# Patient Record
Sex: Male | Born: 1950 | Race: White | Hispanic: No | Marital: Married
Health system: Southern US, Community
[De-identification: ages and names within clinical notes are randomized; demographics above are authoritative.]

---

## 2006-01-14 ENCOUNTER — Observation Stay (HOSPITAL_COMMUNITY): Admission: EM | Admit: 2006-01-14 | Discharge: 2006-01-15 | Payer: Self-pay | Admitting: Emergency Medicine

## 2006-01-16 ENCOUNTER — Emergency Department (HOSPITAL_COMMUNITY): Admission: EM | Admit: 2006-01-16 | Discharge: 2006-01-16 | Payer: Self-pay | Admitting: Emergency Medicine

## 2006-07-23 ENCOUNTER — Ambulatory Visit: Payer: Self-pay | Admitting: Gastroenterology

## 2006-07-24 ENCOUNTER — Ambulatory Visit: Payer: Self-pay | Admitting: Gastroenterology

## 2006-07-24 ENCOUNTER — Encounter (INDEPENDENT_AMBULATORY_CARE_PROVIDER_SITE_OTHER): Payer: Self-pay | Admitting: Specialist

## 2006-07-24 DIAGNOSIS — K449 Diaphragmatic hernia without obstruction or gangrene: Secondary | ICD-10-CM | POA: Insufficient documentation

## 2006-07-24 DIAGNOSIS — K2970 Gastritis, unspecified, without bleeding: Secondary | ICD-10-CM | POA: Insufficient documentation

## 2006-07-24 DIAGNOSIS — K2990 Gastroduodenitis, unspecified, without bleeding: Secondary | ICD-10-CM

## 2006-08-11 ENCOUNTER — Ambulatory Visit: Payer: Self-pay | Admitting: Gastroenterology

## 2006-09-15 ENCOUNTER — Ambulatory Visit: Payer: Self-pay | Admitting: Gastroenterology

## 2007-07-15 DIAGNOSIS — M129 Arthropathy, unspecified: Secondary | ICD-10-CM | POA: Insufficient documentation

## 2008-03-17 IMAGING — CR DG CHEST 1V PORT
1 series · 1 of 1 positions shown · non-contrast
Comparison: None.

CLINICAL DATA: Rapid atrial fibrillation. 
 PORTABLE CHEST ? 1 VIEW:

[view not recorded]
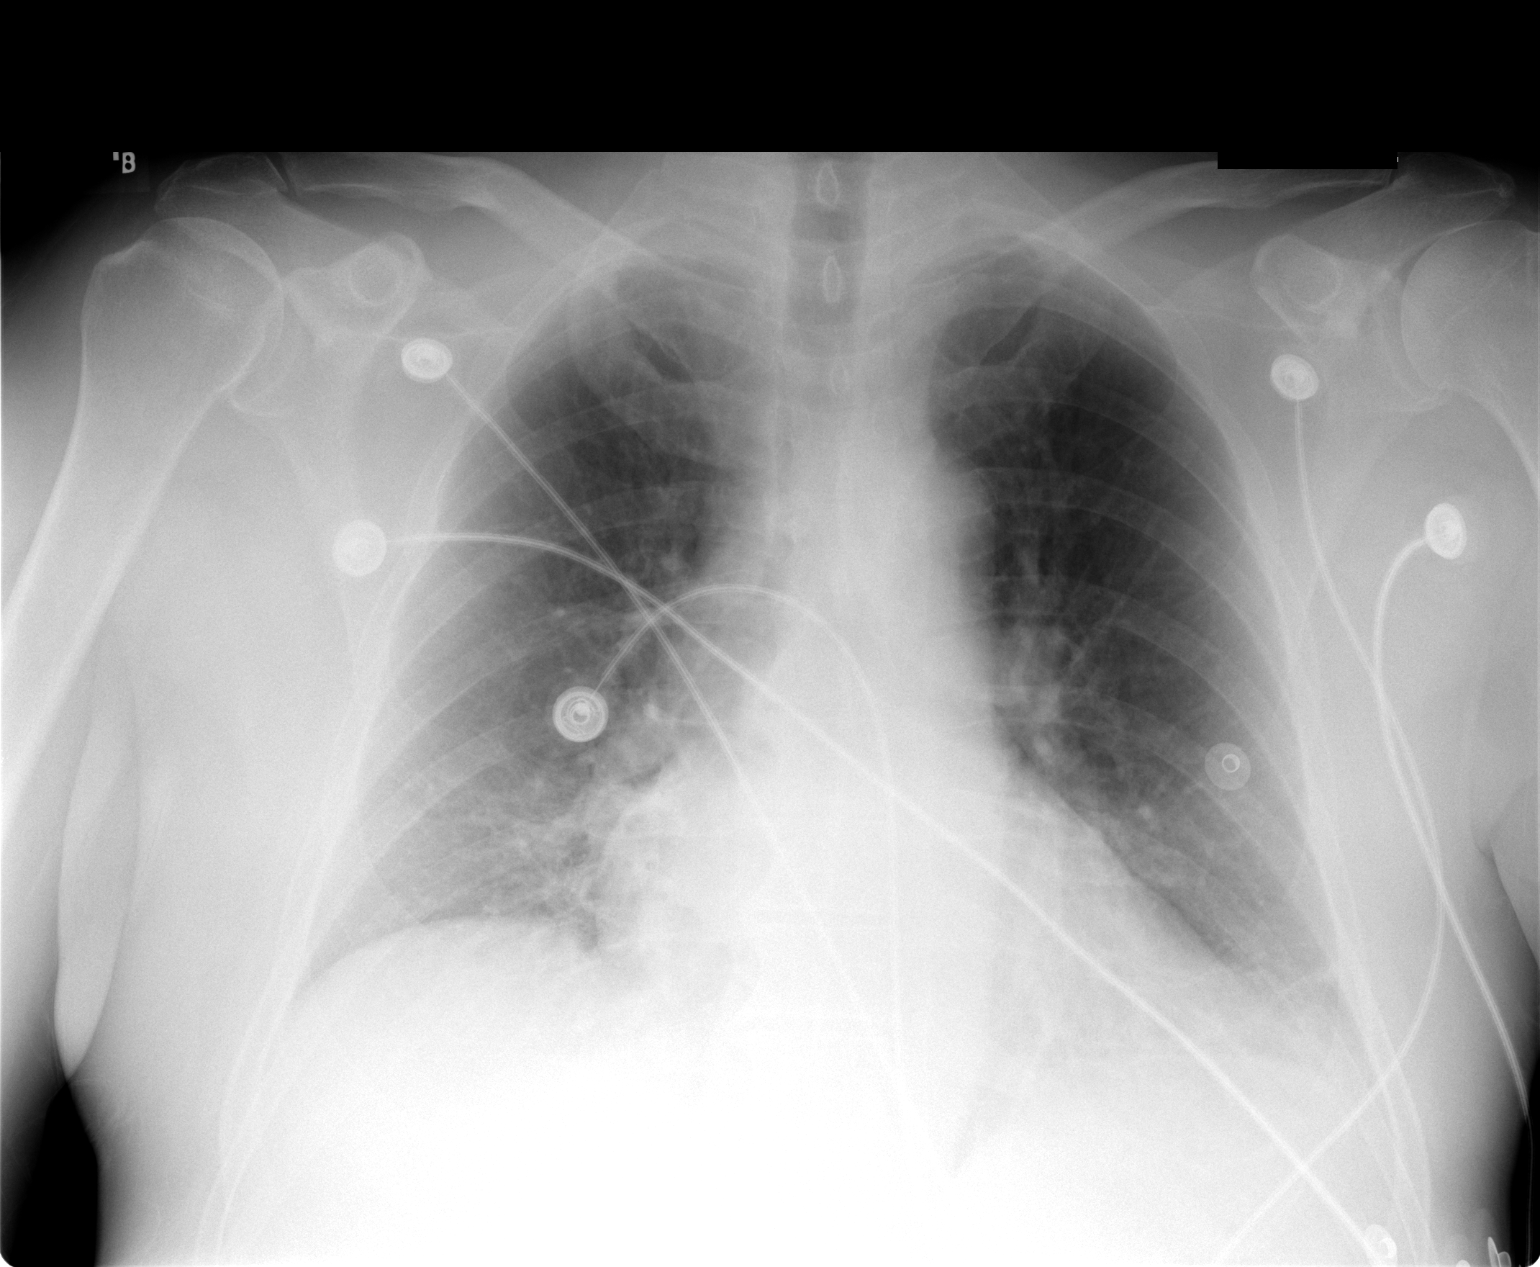

[1 of 1 positions shown; findings below may reference images not displayed]

FINDINGS: The heart size is normal.  There are no effusions or edema.  There is bibasilar atelectasis.
IMPRESSION: 1.  No evidence for heart failure. 
 2.  Bibasilar atelectasis.

## 2010-04-14 ENCOUNTER — Encounter: Payer: Self-pay | Admitting: Otolaryngology

## 2010-08-06 NOTE — Assessment & Plan Note (Signed)
[facility]                         GASTROENTEROLOGY OFFICE NOTE   NAME:[name], Thomas                         MRN:          [id number]  DATE:08/11/2006                            DOB:          [birth year]    [name] underwent endoscopy with empiric dilation on Jul 24, 2006. He  had been on Prevacid for the last week and is markedly improved in his  symptomatology. Is having no further dysphagia. Because of his  symptomatology, which is mostly in the evening, I have switched him to  Prevacid 30 mg before supper in the evening with twice a day therapy if  needed. He continues to have a low-grade fever and is on prednisone 10  mg a day for an unidentified rheumatic problem. I will see him back in  six weeks time or sooner if needed. He is to continue his other  medications as per his other physicians.     Vania Rea. Jarold Motto, MD, Caleen Essex, FAGA  Electronically Signed    DRP/MedQ  DD: 08/11/2006  DT: 08/11/2006  Job #: [id number]   cc:   Tammy R. Collins Scotland, M.D.  Jefry H. Pollyann Kennedy, MD  Aundra Dubin, M.D.

## 2010-08-06 NOTE — Assessment & Plan Note (Signed)
HEALTHCARE                         GASTROENTEROLOGY OFFICE NOTE   NAME:Wee, Darryel                         MRN:          [id number]  DATE:09/15/2006                            DOB:          1950/09/14    PROBLEM:  Gastritis.   HISTORY:  Mr. Critzer is a very nice 60 year old white male who has been  undergoing evaluation with Dr. Jarold Motto for complaints of intermittent  chest pain, dysphagia, and reflux.  He had been given a course of  Prevacid 30 mg b.i.d. after undergoing upper endoscopy on Jul 24, 2006  which did show hiatal hernia.  He did not have evidence of stricture,  but was Elease Hashimoto dilated to 56 due to his symptoms and had antral  gastritis.  He says that he is doing well from a GI standpoint and  really has not been having any difficulty with chest pain, gas,  heartburn, indigestion, etc.  He however has developed a rash over the  past 2-1/2 weeks which he describes as pruritic and somewhat  uncomfortable with a burning quality.  He says that he does feel that it  is progressing.  It is primarily located on his chest and upper back.  He does have a few scattered lesions on his upper extremities as well.  He has seen Dr, Dewain Penning his primary care physician yesterday and  she stopped several of his medications including the Prevacid and he is  scheduled to see a dermatologist on Thursday for a possible biopsy.  It  has been mentioned to him that this may be a vasculitic-type rash,  possibly associated with Lupus.  He has been on a trial of prednisone  per Dr. Kellie Simmering who has been working him up recently as well for other  systemic symptoms with joint pain and swelling, low grade fever, etc.  He is down to 7.5 mg of prednisone currently.   CURRENT MEDICATIONS:  1. Calcium supplement.  2. Prednisone 7.5 mg daily.  3. Iron supplement twice daily.   EXAMINATION:  Well-developed white male in no acute distress.  Weight is  157, blood  pressure 96/72, pulse is 84.  HEENT:  He does have some mild erythema of the face.  He has a fairly  bright, red, rather diffuse rash on the upper neck and chest which does  blanch.  It is fairly diffuse on the upper chest with more scattered  lesions on the lower chest and then on his back.  ABDOMEN:  Soft and benign.  Bowel sounds are active.   IMPRESSION:  32. A 59 year old white male with recent gastritis, resolved.  2. Non-specific upper gastrointestinal symptoms, probably related to      reflux, improved.  3. Status post empiric esophageal dilation with improvement in      symptoms.  He may have underlying esophageal involvement with an      autoimmune disorder as yet unspecified.  4. 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   PLAN:  He was advised to continue off of the the Prevacid as he is  improved, we did ask him to try some over the counter Zantac on a p.r.n.  basis.  He may benefit from the histamine blocker effect of this as  well.  Will follow up on a p.r.n. basis.      Mike Gip, PA-C  Electronically Signed      Vania Rea. Jarold Motto, MD, Caleen Essex, FAGA  Electronically Signed   AE/MedQ  DD: 09/15/2006  DT: 09/16/2006  Job #: [id number]   cc:   Aundra Dubin, M.D.  Tammy R. Collins Scotland, M.D.

## 2010-08-09 NOTE — Assessment & Plan Note (Signed)
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 Collins Scotland for evaluation of dysphagia.   HISTORY OF PRESENT ILLNESS:  Mr. Falotico has had one month of intermittent  solid food dysphagia in his retropharyngeal area.  He has had a negative  ENT exam by Dr. Pollyann Kennedy.  He is scheduled for a barium swallow tomorrow  apparently.  The patient denies reflux symptoms whatsoever but has had  some mild nausea.  All of his problems began, in his mind, after he had  an ibuprofen tablet stuck in his throat.  He has mild chronic functional  constipation.  He uses daily Metamucil, but denies rectal bleeding.  He  had a flexible sigmoidoscopy 5 years ago in [place].  He,  otherwise, denies gastrointestinal problems or any history of  pancreatitis or hepatitis.   The patient had new onset atrial fibrillation in October and was seen by  Dr. Elease Hashimoto and apparently underwent cardioversion.  He was placed on  Crestor, which in his mind caused a rheumatic problem with arthralgias  and myalgias, and he recently discontinued Crestor.  He saw Dr. Aundra Dubin last month and apparently he allegedly said that Dr. Kellie Simmering  told him that he had viral rheumatoid arthritis and placed him on  prednisone 10 mg a day.  He previously had been taking some NSAIDs  without improvement.  Additionally, he is on aspirin 81 mg a day, Osteo  Bi-Flex daily, calcium and vitamin D twice a day and multivitamins.   Since October the patient has had a 12 pound weight loss and he  complains of fever up to 101 to 102 degrees with rather marked night  sweats.  He has had no significant skin rashes.  His only previous  surgery is a tonsillectomy and  what sounds like a hernia repair as a  child.   ALLERGIES:  The patient is allergic to penicillin, latex and statins.   FAMILY HISTORY:  Remarkable for ovarian cancer in his sister and  atherogenesis in both parents.  There are no known gastrointestinal  problems.   SOCIAL HISTORY:  He is married and lives with his wife and son.  He has  a associates degree.  He does not smoke or use ethanol.   REVIEW OF SYSTEMS:  Remarkable for many complaints, including swollen  legs and joints, fever, night sweats, chronic fatigue since January,  persistent sore throat, shortness of breath to exertion, arthralgias  and myalgias.  He denies any other specific cardiopulmonary complaints.  Review of systems otherwise noncontributory.   EXAMINATION:  GENERAL:  Shows him to be a healthy-appearing white male  in no distress, appearing his stated age.  VITAL SIGNS:  He is 5 feet 9 inches and weighs 166 pounds.  Blood  pressure is 114/78, pulse was 80 and regular.  HEENT:  I could not appreciate thyromegaly or lymphadenopathy in  examination.  The oropharynx was unremarkable.  CHEST:  Clear to percussion and auscultation, and there was no murmurs,  gallops or rubs.  He appeared to be in a regular rhythm at this time.  ABDOMEN:  There was no hepatosplenomegaly, abdominal masses or  tenderness.  Bowel sounds were normal.  EXTREMITIES:  I could not appreciate any swollen joints, tender muscles,  edema, etc.  NEUROLOGIC:  His mental status was clear.  RECTAL:  Deferred.   ASSESSMENT:  1. 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  As per above, he denies any      reflux symptoms whatsoever.  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  3. Possible side effects from statin medications.  4. History of spontaneous atrial fibrillation, currently on no      medications except aspirin.   5. Chronic functional constipation with Metamucil use.  He has never      had full colonoscopy.  6. Latex and penicillin allergies.   RECOMMENDATIONS:  I am going ahead and setting Mr. Pare up for  endoscopy tomorrow for diagnostic and possible therapeutic purposes.  His further workup will depend on the results of this exam.  At some  point, he will need full screening colonoscopy.  I will ask Dr. Kellie Simmering  and Dr. Elease Hashimoto to send his pertinent records.     Vania Rea. Jarold Motto, MD, Caleen Essex, FAGA  Electronically Signed    DRP/MedQ  DD: 07/23/2006  DT: 07/23/2006  Job #: [id number]   cc:   Tammy R. Collins Scotland, M.D.  Jefry H. Pollyann Kennedy, MD  Aundra Dubin, M.D.

## 2010-08-09 NOTE — H&P (Signed)
Thomas [name], Thomas [name]                ACCOUNT NO.:  [id number]   MEDICAL RECORD NO.:  [id number]          PATIENT TYPE:  INP   LOCATION:  1843                         FACILITY:  [facility]   PHYSICIAN:  Raymon Mutton, P.A. DATE OF BIRTH:  November 12, 1950   DATE OF ADMISSION:  01/14/2006  DATE OF DISCHARGE:                                HISTORY & PHYSICAL   CHIEF COMPLAINT:  Chest pressure and heaviness, epigastric fullness.   HISTORY OF PRESENT ILLNESS:  A 60 year old Caucasian gentleman without any  previous cardiac history who has been having epigastric fullness with  frequent eructations and chest pressure and a heaviness since Monday,  January 12, 2006.  He hoped that the symptoms would go away and did not want  to alarm  his wife, but symptoms are gradually worsening and eventually the  patient went to see his primary care physician, Dr. Herb Grays.  At the  office of PCP the patient had an EKG which revealed atrial fibrillation with  rapid ventricular response.  EMS was summoned and the patient was  transferred to [facility] emergency room for further cardiology  evaluation.   Of note, the patient had 11-12 bottles of beer on Saturday, January 10, 2006, but did not have any symptoms up until Monday.   He also denies any awareness of palpitations or flutters and does not feel  any irregular heart rhythm.   PAST MEDICAL HISTORY:  Unrevealing except neck arthritis and overall the  patient considers himself healthy.   FAMILY HISTORY:  Positive for coronary disease.  Both parents, mother and  father, had history of MI.   SOCIAL HISTORY:  He is married with 4 children, 4 grandchildren.  He quit  smoking 23 years ago.  He drinks on the weekends mainly beer.   He is physically active, plays golf, hikes and walks a lot.   MEDICATIONS:  Multivitamin daily and Osteoflex daily.   ALLERGIES:  PENICILLIN.   REVIEW OF SYSTEMS:  Chest pain and pressure.  Some feeling like a  weight  sitting on the chest, worsening since Monday and also aggravated by  movements or deep breathing.  Epigastric fullness with frequent eructations,  indigestion.  He also has diminished hearing and experiences some occasional  neck pain or stiffness.  Denied any palpitations or any flutters, any  sensation of racing heart rate.   All other review of systems is negative.   PHYSICAL EXAMINATION:  His blood pressure is 119/86, heart rate 101,  temperature 99.8, respirations 18, O2 sat is 94% on room air.  GENERAL:  Alert, oriented x3 in no acute distress.  HEENT:  Normocephalic, atraumatic.  Extraocular movements intact.  NECK:  No JVD, no carotid bruits.  LUNGS:  Diminished breath sounds at the bases.  Otherwise clear to  auscultation bilaterally.  HEART:  Irregularly irregular, rapid S1 and S2.  No murmurs, gallops or  rubs.  ABDOMEN:  Soft, nontender, nondistended with active bowel sounds in all four  quadrants.  EXTREMITIES:  Without edema with 1+ dorsalis pedis pulses bilaterally.  NEUROLOGIC:  Nonfocal.  Grossly intact.  EKG showed atrial fibrillation with rapid ventricular response and  questionable T-wave ischemic changes/inversion in leads III and AVF.  A  chest x-ray showed bibasilar atelectasis.  No acute disease otherwise.   LABORATORY DATA:  Hemoglobin 15.5, hematocrit 45.1, white blood cell count  13.5, platelet count 332, sodium 139, potassium 4.1, chloride 107, CO2 26,  BUN 9, creatinine 1.0, glucose 105.   IMPRESSION:  1. Atrial fibrillation with rapid ventricular response.  New diagnosis,      duration unknown since the patient is asymptomatic.  2. Chest pressure/heaviness.  3. Family history of coronary artery disease.  4. Unknown lipid profile.   PLAN:  We will admit the patient to the telemetry unit on rule out MI  protocol.  Cycle his enzymes.  Start him on Cardizem drip.  A bolus of 20 mg  of Cardizem was given en route.  We will start him on heparin  IV per  pharmacy protocol because duration of his AFib is unknown since the patient  was asymptomatic with palpitations or flutters.  Also we will check TSH and  magnesium level to make sure that hormonal and/or electrolyte abnormalities  are not contributing factors to AFib and we will also check fasting lipid  profile and liver function tests.      Raymon Mutton, P.A.     MK/MEDQ  D:  01/14/2006  T:  01/14/2006  Job:  [id number]   cc:   Tammy R. Collins Scotland, M.D.  [facility]

## 2010-08-09 NOTE — Cardiovascular Report (Signed)
NAMEJETHRO, RADKE                ACCOUNT NO.:  [id number]   MEDICAL RECORD NO.:  [id number]          PATIENT TYPE:  INP   LOCATION:  2915                         FACILITY:  [facility]   PHYSICIAN:  Cristy Hilts. Jacinto Halim, MD       DATE OF BIRTH:  [birth year]   DATE OF PROCEDURE:  DATE OF DISCHARGE:                              CARDIAC CATHETERIZATION   DATE OF PROCEDURE:  01/14/2006   REFERRING PHYSICIAN:  Rubye Oaks, NP   PROCEDURE PERFORMED:  1. Left ventriculography.  2. Select right left coronary arteriography.  3. Right femoral angiography and closure of right femoral artery access      with StarClose.   INDICATIONS:  Mr. Guiffre is a 60 year old gentleman with no significant prior  cardiac history, was admitted through the emergency room with new onset of  atrial fibrillation associated with chest pain and shortness of breath.  Because of this he was admitted to the hospital.  Myocardial infarction was  ruled out and he is now brought back to the cardiac catheterization lab to  evaluate his coronary anatomy and to exclude ischemia as an etiology for his  atrial fibrillation.   HEMODYNAMIC DATA:  The left ventricular pressures were 101/15 with an end-  diastolic pressure of 21 mmHg.  Aortic pressure 107/65 with a mean of 89  mmHg.  There was no pressure gradient across the aortic valve.   ANGIOGRAPHIC DATA:  1. Left ventricle.  The left ventricular systolic function was normal with      an ejection fraction of 55-60%.  2. Right coronary artery.  The right coronary artery is a large caliber.      Smooth and normal.  3. Left main.  The left main is a short vessel and is normal.  4. Circumflex.  Circumflex is a moderately large caliber vessel with a      proximal segment.  The distal circumflex continues in the AV groove      after giving origin to a moderate to large size obtuse marginal one.      It is smooth and normal.  5. Ramus intermedius.  The ramus intermedius is a large  caliber vessel.      Smooth and normal.  6. LAD.  The LAD is a large caliber vessel.  There is an eccentric 30%      stenosis noted in the proximal segment of the LAD at the origin of a      small to moderate size septal perforator.  The septal perforator itself      has 80-90% ostial stenosis.  Otherwise, the LAD is smooth and in the      mid to distal segment there is a 30% luminal obstruction with evidence      of mild intramyocardial bridging.   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  There is an eccentric  plaque which constitutes of about 30% stenosis of the proximal left anterior  descending at the origin of this septal perforator.  There is a 20-30% mid  to distal left anterior descending stenosis.  Otherwise, the coronary  arteries are smooth and normal.   RECOMMENDATIONS:  1. 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  He will      be started on Crestor at 20 mg once a day and will follow up with Dr.      Rubye Oaks for further management of the same.  His total      cholesterol is 186, triglycerides 88, HDL 51, LDL was 170.  Target LDL      will be closer to 70.  2. He will also be started on aspirin once a day.  3. Unless he has recurrence of atrial fibrillation, he will be continued      on aspirin alone.  If he does have recurrent atrial fibrillation, then      either consideration for atrial fibrillation ablation to be kept in      mind.  4. I also advised the patient to avoid excessive alcohol intake and      potentially try to be abstinent from alcohol.   PROCEDURE:  A total of 75 mL of contrast was utilized for diagnostic  angiography.  Under usual sterile precautions using a 6-French straight  femoral arterial access, a 6-French multipurpose B2 catheter was advanced  into the ascending  aorta over a 0.035 inch J-wire.  The catheter was gently  advanced over the left ventricle and left ventricular pressure monitored.  The catheter was flushed with saline and pulled back into the ascending  aorta and pressure gradient across the aortic valve was monitored.  Right  coronary artery selective angiography was performed.  Then the left main  coronary artery selective coronary angiography was performed. Then the  catheter is pulled out of the body in the usual fashion.  Right femoral  angiography was performed through the arterial access sheath and the access  was closed with StarClose with excellent hemostasis.  The patient tolerated  the procedure.      Cristy Hilts. Jacinto Halim, MD  Electronically Signed     JRG/MEDQ  D:  01/15/2006  T:  01/16/2006  Job:  [id number]   cc:   Rubye Oaks, NP

## 2010-08-09 NOTE — H&P (Signed)
Thomas [name], Thomas [name]                ACCOUNT NO.:  [id number]   MEDICAL RECORD NO.:  [id number]          PATIENT TYPE:  INP   LOCATION:  1843                         FACILITY:  [facility]   PHYSICIAN:  Raymon Mutton, P.A. DATE OF BIRTH:  01-Aug-1950   DATE OF ADMISSION:  01/14/2006  DATE OF DISCHARGE:                                HISTORY & PHYSICAL   Audio too short to transcribe (less than 5 seconds)      Raymon Mutton, P.A.     MK/MEDQ  D:  01/14/2006  T:  01/14/2006  Job:  [id number]

## 2019-05-02 ENCOUNTER — Ambulatory Visit: Payer: Medicare Other | Attending: Internal Medicine

## 2019-05-02 DIAGNOSIS — Z23 Encounter for immunization: Secondary | ICD-10-CM | POA: Insufficient documentation

## 2019-05-02 NOTE — Progress Notes (Signed)
   Covid-19 Vaccination Clinic  Name:  JIMEL MYLER    MRN: [id number] DOB: 11-15-1950  05/02/2019  Mr. Kosta was observed post Covid-19 immunization for 15 minutes without incidence. He was provided with Vaccine Information Sheet and instruction to access the V-Safe system.   Mr. Manson was instructed to call 911 with any severe reactions post vaccine: Marland Kitchen Difficulty breathing  . Swelling of your face and throat  . A fast heartbeat  . A bad rash all over your body  . Dizziness and weakness    Immunizations Administered    Name Date Dose VIS Date Route   Pfizer COVID-19 Vaccine 05/02/2019 11:54 AM 0.3 mL 03/04/2019 Intramuscular   Manufacturer: [employer], [employer]   Lot: [id number]   NDC: [address]-7

## 2019-05-19 ENCOUNTER — Ambulatory Visit: Payer: Self-pay

## 2019-05-26 ENCOUNTER — Ambulatory Visit: Payer: Medicare Other | Attending: Internal Medicine

## 2019-05-26 DIAGNOSIS — Z23 Encounter for immunization: Secondary | ICD-10-CM | POA: Insufficient documentation

## 2019-05-26 NOTE — Progress Notes (Signed)
   Covid-19 Vaccination Clinic  Name:  JAIYDEN LAUR    MRN: [id number] DOB: 01-30-1951  05/26/2019  Mr. Selley was observed post Covid-19 immunization for 15 minutes without incident. He was provided with Vaccine Information Sheet and instruction to access the V-Safe system.   Mr. Moradi was instructed to call 911 with any severe reactions post vaccine: Marland Kitchen Difficulty breathing  . Swelling of face and throat  . A fast heartbeat  . A bad rash all over body  . Dizziness and weakness   Immunizations Administered    Name Date Dose VIS Date Route   Pfizer COVID-19 Vaccine 05/26/2019  1:24 PM 0.3 mL 03/04/2019 Intramuscular   Manufacturer: [employer], [employer]   Lot: [id number]   NDC: [address]-1
